# Patient Record
Sex: Male | Born: 1975 | Race: Asian | Hispanic: No | Marital: Married | Smoking: Never smoker
Health system: Southern US, Community
[De-identification: ages and names within clinical notes are randomized; demographics above are authoritative.]

## PROBLEM LIST (undated history)

## (undated) DIAGNOSIS — L503 Dermatographic urticaria: Secondary | ICD-10-CM

## (undated) HISTORY — DX: Dermatographic urticaria: L50.3

---

## 2015-10-20 ENCOUNTER — Ambulatory Visit (INDEPENDENT_AMBULATORY_CARE_PROVIDER_SITE_OTHER): Payer: Managed Care, Other (non HMO) | Admitting: Physician Assistant

## 2015-10-20 ENCOUNTER — Ambulatory Visit (INDEPENDENT_AMBULATORY_CARE_PROVIDER_SITE_OTHER): Payer: Managed Care, Other (non HMO)

## 2015-10-20 VITALS — BP 116/72 | HR 69 | Temp 98.2°F | Resp 16 | Ht 68.75 in | Wt 159.0 lb

## 2015-10-20 DIAGNOSIS — R51 Headache: Secondary | ICD-10-CM

## 2015-10-20 DIAGNOSIS — J320 Chronic maxillary sinusitis: Secondary | ICD-10-CM | POA: Diagnosis not present

## 2015-10-20 DIAGNOSIS — R519 Headache, unspecified: Secondary | ICD-10-CM

## 2015-10-20 MED ORDER — IBUPROFEN 800 MG PO TABS: ORAL_TABLET | ORAL | Status: DC

## 2015-10-20 MED ORDER — KETOROLAC TROMETHAMINE 60 MG/2ML IM SOLN
60.0000 mg | Freq: Once | INTRAMUSCULAR | Status: AC
  Administered 2015-10-20: 60 mg via INTRAMUSCULAR

## 2015-10-20 NOTE — Progress Notes (Signed)
10/21/2015 10:36 AM   DOB: 1975/08/30 / MRN: [id number]  SUBJECTIVE:  Jeffrey Bender is a 40 y.o. well apperaing male presenting for left sided HA that occurred yesterday and lasted roughly 4-5 hours then resolved.  Reports it started again today at 11 am and is now easing off.  Complains of scotoma of the left eye.  Denies nausea and pulsatility.  He has tried one 500 mg Tylenol with no relief.  No new stressors at home or work.  He did take a sick day today from work due to the HA.  His wife is with him and is very worried.    He has No Known Allergies.   He  has no past medical history on file.    He  reports that he has never smoked. He does not have any smokeless tobacco history on file. He reports that he drinks alcohol. He reports that he does not use illicit drugs. He  has no sexual activity history on file. The patient  has no past surgical history on file.  His family history is not on file.  Review of Systems  Constitutional: Negative for fever.  HENT: Negative for congestion, ear discharge, ear pain, hearing loss and sore throat.   Eyes: Negative for redness.  Gastrointestinal: Negative for abdominal pain.  Musculoskeletal: Negative for neck pain.  Skin: Negative for rash.  Neurological: Negative for dizziness.    Problem list and medications reviewed and updated by myself where necessary, and exist elsewhere in the encounter.   OBJECTIVE:  BP 116/72 mmHg  Pulse 69  Temp(Src) 98.2 F (36.8 C) (Oral)  Resp 16  Ht 5' 8.75" (1.746 m)  Wt 159 lb (72.122 kg)  BMI 23.66 kg/m2  SpO2 98%  Physical Exam  Constitutional: He is oriented to person, place, and time. He appears well-developed. He does not appear ill.  Eyes: Conjunctivae and EOM are normal. Pupils are equal, round, and reactive to light.  Cardiovascular: Normal rate.   Pulmonary/Chest: Effort normal.  Abdominal: He exhibits no distension.  Musculoskeletal: Normal range of motion.  Neurological:  He is alert and oriented to person, place, and time. He has normal reflexes. No cranial nerve deficit or sensory deficit. Coordination and gait normal.  Heel and toe walking intact.   Skin: Skin is warm and dry. He is not diaphoretic.  Psychiatric: He has a normal mood and affect.  Nursing note and vitals reviewed.   No results found for this or any previous visit (from the past 72 hour(s)).  Dg Sinuses Complete  10/20/2015  CLINICAL DATA:  Headache.  Sinus tenderness. EXAM: PARANASAL SINUSES - COMPLETE 3 + VIEW COMPARISON:  None. FINDINGS: Marked right maxillary sinus mucosal thickening. No paranasal sinus air-fluid levels. IMPRESSION: Marked chronic right maxillary sinusitis. Electronically Signed   By: Beckie Salts M.D.   On: 10/20/2015 17:23    ASSESSMENT AND PLAN  Jeffrey Bender was seen today for headache.  Diagnoses and all orders for this visit:  Headache above the eye region -     ketorolac (TORADOL) injection 60 mg; Inject 2 mLs (60 mg total) into the muscle once. -     DG Sinuses Complete; Future -     Cancel: Ambulatory referral to Neurology  Chronic maxillary sinusitis: Given xray finding will treat.  Advised that if the Augmentin fails to help him in the first ten day then to call me and I will reinstate referral to neurology.  -     amoxicillin-clavulanate (  AUGMENTIN) 500-125 MG tablet; Take 1 tablet (500 mg total) by mouth 2 (two) times daily. If this resolves your headache then fill again and complete the course. -     amoxicillin-clavulanate (AUGMENTIN) 500-125 MG tablet; Take 1 tablet (500 mg total) by mouth 2 (two) times daily. Fill after 10/30/15.  Other orders -     ibuprofen (ADVIL,MOTRIN) 800 MG tablet; Take with food at the onset of headache.  Do not take Aleve or Goody's while taking this medication.    The patient was advised to call or return to clinic if he does not see an improvement in symptoms or to seek the care of the closest emergency department if he  worsens with the above plan.   Deliah BostonMichael Clark, MHS, PA-C [facility] 10/21/2015 10:36 AM

## 2015-10-20 NOTE — Patient Instructions (Signed)
     IF you received an x-ray today, you will receive an invoice from [facility]. Please contact [facility] at [phone] with questions or concerns regarding your invoice.   IF you received labwork today, you will receive an invoice from [facility]. Please contact [facility] at [phone] with questions or concerns regarding your invoice.   Our billing staff will not be able to assist you with questions regarding bills from these companies.  You will be contacted with the lab results as soon as they are available. The fastest way to get your results is to activate your My Chart account. Instructions are located on the last page of this paperwork. If you have not heard from us regarding the results in 2 weeks, please contact this office.      

## 2015-10-21 MED ORDER — AMOXICILLIN-POT CLAVULANATE 500-125 MG PO TABS: 1.0000 | ORAL_TABLET | Freq: Two times a day (BID) | ORAL | Status: DC

## 2015-10-22 ENCOUNTER — Telehealth: Payer: Self-pay | Admitting: Family Medicine

## 2015-10-22 ENCOUNTER — Encounter (HOSPITAL_COMMUNITY): Payer: Self-pay | Admitting: *Deleted

## 2015-10-22 ENCOUNTER — Emergency Department (HOSPITAL_COMMUNITY): Payer: Managed Care, Other (non HMO)

## 2015-10-22 ENCOUNTER — Emergency Department (HOSPITAL_COMMUNITY)
Admission: EM | Admit: 2015-10-22 | Discharge: 2015-10-22 | Disposition: A | Discharge status: Home / Self Care | Payer: Managed Care, Other (non HMO) | Attending: Emergency Medicine | Admitting: Emergency Medicine

## 2015-10-22 DIAGNOSIS — Z792 Long term (current) use of antibiotics: Secondary | ICD-10-CM | POA: Diagnosis not present

## 2015-10-22 DIAGNOSIS — J329 Chronic sinusitis, unspecified: Secondary | ICD-10-CM | POA: Insufficient documentation

## 2015-10-22 DIAGNOSIS — R519 Headache, unspecified: Secondary | ICD-10-CM

## 2015-10-22 DIAGNOSIS — Z79899 Other long term (current) drug therapy: Secondary | ICD-10-CM | POA: Diagnosis not present

## 2015-10-22 DIAGNOSIS — R51 Headache: Secondary | ICD-10-CM

## 2015-10-22 NOTE — ED Provider Notes (Signed)
CSN: [id number]     Arrival date & time 10/22/15  1021 History   First MD Initiated Contact with Patient 10/22/15 1104     Chief Complaint  Patient presents with  . Eye Pain   HPI   Jeffrey Bender is an otherwise healthy 40 y.o. male who presents to the ED for evaluation of headache. He states that on 4/13 he developed a severe headache behind his left eye. He states that the episode resolved on its own after a few hours. He states the following day the headache returned with associated blurry vision so he went to [facility]. He states at the [facility] he was told he has a right sided sinus infection and given a prescription for antibiotics and ibuprofen. He states he has taken the medication as prescribed and while he is taking the ibuprofen the pain will go down to 4/10 but once it wears off the pain returns. He states he was told to come to the ED for his persistent headache for CT scan. Pt states last dose ibuprofen 9:30 AM and right now the pain is tolerable with no blurry vision. Denies any associated dizziness, fever, chills, neck rigidity, nausea, vomiting. Denies URI symptoms including cough, nasal congestion, and rhinorrhea. His wife is with him who is very worried. Pt states he typically does not have headaches and has never had pain like this before. Declines pain medication/migraine cocktail at this time.  History reviewed. No pertinent past medical history. History reviewed. No pertinent past surgical history. No family history on file. Social History  Substance Use Topics  . Smoking status: Never Smoker   . Smokeless tobacco: None  . Alcohol Use: Yes    Review of Systems  All other systems reviewed and are negative.     Allergies  Review of patient's allergies indicates no known allergies.  Home Medications   Prior to Admission medications   Medication Sig Start Date End Date Taking? Authorizing Provider  amoxicillin-clavulanate (AUGMENTIN) 500-125 MG tablet Take 1 tablet  (500 mg total) by mouth 2 (two) times daily. If this resolves your headache then fill again and complete the course. 10/21/15  Yes Ofilia Neas, PA-C  ibuprofen (ADVIL,MOTRIN) 200 MG tablet Take 200-600 mg by mouth every 6 (six) hours as needed for moderate pain.   Yes Historical Provider, MD  Multiple Vitamin (MULTIVITAMIN WITH MINERALS) TABS tablet Take 1 tablet by mouth daily.   Yes Historical Provider, MD  amoxicillin-clavulanate (AUGMENTIN) 500-125 MG tablet Take 1 tablet (500 mg total) by mouth 2 (two) times daily. Fill after 10/30/15. Patient not taking: Reported on 10/22/2015 10/21/15   Ofilia Neas, PA-C  ibuprofen (ADVIL,MOTRIN) 800 MG tablet Take with food at the onset of headache.  Do not take Aleve or Goody's while taking this medication. Patient not taking: Reported on 10/22/2015 10/20/15   Ofilia Neas, PA-C   BP 132/72 mmHg  Pulse 59  Temp(Src) 97.8 F (36.6 C) (Oral)  Resp 16  SpO2 100% Physical Exam  Constitutional: He is oriented to person, place, and time.  HENT:  Right Ear: External ear normal.  Left Ear: External ear normal.  Nose: Nose normal.  Mouth/Throat: Oropharynx is clear and moist. No oropharyngeal exudate.  +L frontal sinus tenderness  Eyes: Conjunctivae and EOM are normal. Pupils are equal, round, and reactive to light.  Neck: Normal range of motion. Neck supple.  Cardiovascular: Normal rate, regular rhythm, normal heart sounds and intact distal pulses.   Pulmonary/Chest: Effort normal  and breath sounds normal. No respiratory distress. He has no wheezes. He exhibits no tenderness.  Abdominal: Soft. Bowel sounds are normal. He exhibits no distension. There is no tenderness. There is no rebound and no guarding.  Musculoskeletal: He exhibits no edema.  Neurological: He is alert and oriented to person, place, and time. He has normal strength. No cranial nerve deficit or sensory deficit. He displays a negative Romberg sign. Coordination and gait normal. GCS  eye subscore is 4. GCS verbal subscore is 5. GCS motor subscore is 6.  Skin: Skin is warm and dry.  Psychiatric: He has a normal mood and affect.  Nursing note and vitals reviewed.   ED Course  Procedures (including critical care time) Labs Review Labs Reviewed - No data to display  Imaging Review Dg Sinuses Complete  10/20/2015  CLINICAL DATA:  Headache.  Sinus tenderness. EXAM: PARANASAL SINUSES - COMPLETE 3 + VIEW COMPARISON:  None. FINDINGS: Marked right maxillary sinus mucosal thickening. No paranasal sinus air-fluid levels. IMPRESSION: Marked chronic right maxillary sinusitis. Electronically Signed   By: Beckie Salts M.D.   On: 10/20/2015 17:23   Ct Head Wo Contrast  10/22/2015  CLINICAL DATA:  Left periorbital headache. Blurred vision in the left thigh. EXAM: CT HEAD WITHOUT CONTRAST TECHNIQUE: Contiguous axial images were obtained from the base of the skull through the vertex without intravenous contrast. COMPARISON:  10/20/2015 sinus radiographs. FINDINGS: No evidence of parenchymal hemorrhage or extra-axial fluid collection. No mass lesion, mass effect, or midline shift. No CT evidence of acute infarction. Cerebral volume is age appropriate. No ventriculomegaly. Near complete opacification of the visualized right maxillary sinus. Essentially complete opacification of the bilateral ethmoidal air cells. Fluid level in the sphenoid sinus. Complete opacification of the bilateral frontal sinus. The mastoid air cells are unopacified. No evidence of calvarial fracture. Mildly expansile 2 cm lytic lesion in right calvarium near the vertex (series 3/ image 30), which demonstrates a narrow zone of transition and thin sclerotic margin. IMPRESSION: 1.  No evidence of acute intracranial abnormality. 2. Complete opacification of the bilateral frontal sinus and ethmoidal air cells. Near complete opacification of the visualized right maxillary sinus. Fluid level in the sphenoid sinus. Findings suggest  severe acute on chronic sinusitis, with underlying polyps or other mass lesions not excluded. Consider dedicated sinus CT on a short term outpatient basis as clinically warranted. 3. Nonspecific mildly expansile 2 cm lytic lesion in the right calvarium near the vertex without aggressive features, probably benign. Electronically Signed   By: Delbert Phenix M.D.   On: 10/22/2015 12:24   I have personally reviewed and evaluated these images and lab results as part of my medical decision-making.   EKG Interpretation None      MDM   Final diagnoses:  Acute nonintractable headache, unspecified headache type  Chronic sinusitis, unspecified location    C head negative for emergent intracranial abnormality. tehre is severe acute on chronic sinusitis with recommendations for sinus dedicated CT on outpatient basis. Pt does report his headache continues to be well controlled with home ibuprofen in the ED. He is neurologically intact with no focal deficits. Suspect migraine vs cluster headache and encouraged continued ibuprofen as needed with referral to neuro given for follow up. Regarding sinusitis, pt denies any congestion/URI symptoms. He was given a prescription for Augmentin at [facility]. Instructed to finish Augmentin as prescribed and f/u with ENT this week for further evaluation. Pt and his wife verbalized understanding and agreement. ER return precautions given.  Carlene CoriaSerena Y Matheson Vandehei, PA-C 10/23/15 [phone]  Bethann BerkshireJoseph Zammit, MD 10/24/15 [phone]

## 2015-10-22 NOTE — Telephone Encounter (Signed)
Wife called back stating that husband has unbearable left sided headache.  [facility]

## 2015-10-22 NOTE — ED Notes (Signed)
Patient transported to CT 

## 2015-10-22 NOTE — ED Notes (Signed)
Pt's wife reports pt started to have pain behind his L eye Thursday, [facility].  Was told he had a sinus infection and was given abx and pain med without relief.  Pt was instructed to come to the ED by his doctor is not getting any better to have a CT done d/t possible infection.  Pt denies any facial numbness or droop at this time.  No other obvious neuro deficits at this time.

## 2015-10-22 NOTE — Telephone Encounter (Signed)
Answering service called saying patient still has headache and would like a call back.  I left a message on the answering machine to call back or RTC.

## 2015-10-22 NOTE — Discharge Instructions (Signed)
Your CT scan today showed chronic sinusitis with acute exacerbation. It was otherwise unremarkable. Please call Dr. Lucky Rathkeosen's office (Ear, nose, throat specialist) to schedule a follow up evaluation of your sinusitis that was seen on CT scan today. You might need a special CT scan of the sinuses specifically for further evaluation. Finish taking your antibiotics as prescribed.  Otherwise the pain around your left eye is likely a bad headache. Take the ibuprofen you have as needed for pain. If you keep getting headaches please call Neurology to schedule a follow up appointment. Return to the ER for new or worsening symptoms.    Sinusitis, Adult Sinusitis is redness, soreness, and inflammation of the paranasal sinuses. Paranasal sinuses are air pockets within the bones of your face. They are located beneath your eyes, in the middle of your forehead, and above your eyes. In healthy paranasal sinuses, mucus is able to drain out, and air is able to circulate through them by way of your nose. However, when your paranasal sinuses are inflamed, mucus and air can become trapped. This can allow bacteria and other germs to grow and cause infection. Sinusitis can develop quickly and last only a short time (acute) or continue over a long period (chronic). Sinusitis that lasts for more than 12 weeks is considered chronic. CAUSES Causes of sinusitis include:  Allergies.  Structural abnormalities, such as displacement of the cartilage that separates your nostrils (deviated septum), which can decrease the air flow through your nose and sinuses and affect sinus drainage.  Functional abnormalities, such as when the small hairs (cilia) that line your sinuses and help remove mucus do not work properly or are not present. SIGNS AND SYMPTOMS Symptoms of acute and chronic sinusitis are the same. The primary symptoms are pain and pressure around the affected sinuses. Other symptoms include:  Upper  toothache.  Earache.  Headache.  Bad breath.  Decreased sense of smell and taste.  A cough, which worsens when you are lying flat.  Fatigue.  Fever.  Thick drainage from your nose, which often is green and may contain pus (purulent).  Swelling and warmth over the affected sinuses. DIAGNOSIS Your health care provider will perform a physical exam. During your exam, your health care provider may perform any of the following to help determine if you have acute sinusitis or chronic sinusitis:  Look in your nose for signs of abnormal growths in your nostrils (nasal polyps).  Tap over the affected sinus to check for signs of infection.  View the inside of your sinuses using an imaging device that has a light attached (endoscope). If your health care provider suspects that you have chronic sinusitis, one or more of the following tests may be recommended:  Allergy tests.  Nasal culture. A sample of mucus is taken from your nose, sent to a lab, and screened for bacteria.  Nasal cytology. A sample of mucus is taken from your nose and examined by your health care provider to determine if your sinusitis is related to an allergy. TREATMENT Most cases of acute sinusitis are related to a viral infection and will resolve on their own within 10 days. Sometimes, medicines are prescribed to help relieve symptoms of both acute and chronic sinusitis. These may include pain medicines, decongestants, nasal steroid sprays, or saline sprays. However, for sinusitis related to a bacterial infection, your health care provider will prescribe antibiotic medicines. These are medicines that will help kill the bacteria causing the infection. Rarely, sinusitis is caused by a fungal  infection. In these cases, your health care provider will prescribe antifungal medicine. For some cases of chronic sinusitis, surgery is needed. Generally, these are cases in which sinusitis recurs more than 3 times per year, despite  other treatments. HOME CARE INSTRUCTIONS  Drink plenty of water. Water helps thin the mucus so your sinuses can drain more easily.  Use a humidifier.  Inhale steam 3-4 times a day (for example, sit in the bathroom with the shower running).  Apply a warm, moist washcloth to your face 3-4 times a day, or as directed by your health care provider.  Use saline nasal sprays to help moisten and clean your sinuses.  Take medicines only as directed by your health care provider.  If you were prescribed either an antibiotic or antifungal medicine, finish it all even if you start to feel better. SEEK IMMEDIATE MEDICAL CARE IF:  You have increasing pain or severe headaches.  You have nausea, vomiting, or drowsiness.  You have swelling around your face.  You have vision problems.  You have a stiff neck.  You have difficulty breathing.   This information is not intended to replace advice given to you by your health care provider. Make sure you discuss any questions you have with your health care provider.   Document Released: 06/24/2005 Document Revised: 07/15/2014 Document Reviewed: 07/09/2011 Elsevier Interactive Patient Education [employer]

## 2015-11-06 HISTORY — PX: SINOSCOPY: SHX187

## 2015-12-18 ENCOUNTER — Ambulatory Visit: Payer: Managed Care, Other (non HMO) | Admitting: Allergy and Immunology

## 2016-01-24 ENCOUNTER — Ambulatory Visit: Payer: Managed Care, Other (non HMO) | Admitting: Allergy and Immunology

## 2016-02-14 ENCOUNTER — Encounter: Payer: Self-pay | Admitting: Allergy & Immunology

## 2016-02-14 ENCOUNTER — Ambulatory Visit (INDEPENDENT_AMBULATORY_CARE_PROVIDER_SITE_OTHER): Payer: Managed Care, Other (non HMO) | Admitting: Allergy & Immunology

## 2016-02-14 VITALS — BP 120/70 | HR 87 | Temp 98.2°F | Resp 16 | Ht 71.0 in | Wt 160.8 lb

## 2016-02-14 DIAGNOSIS — L503 Dermatographic urticaria: Secondary | ICD-10-CM

## 2016-02-14 DIAGNOSIS — J310 Chronic rhinitis: Secondary | ICD-10-CM | POA: Insufficient documentation

## 2016-02-14 DIAGNOSIS — Z9889 Other specified postprocedural states: Secondary | ICD-10-CM | POA: Insufficient documentation

## 2016-02-14 HISTORY — DX: Dermatographic urticaria: L50.3

## 2016-02-14 NOTE — Patient Instructions (Signed)
1. Chronic rhinitis - Testing today showed evidence of allergies to grass, weeds, trees, mold, dust mite, mixed feathers - However, with your sensitive skin it was difficult to interpret them. - To confirm these allergies, we are going to get lab work.  - We will call you in one week with the results - In the meantime, start Dymista one spray per nostril twice daily. This contains a nasal steroid to control inflammation and a nasal antihistamine to control allergic responses in the nose.   2. Return to clinic as needed for now.   It was a pleasure to meet you today!

## 2016-02-14 NOTE — Progress Notes (Signed)
NEW PATIENT  Date of Service/Encounter:  02/14/16   Assessment:   Chronic rhinitis - SPT positive to grass, weeds, trees, mold, dust mite, mixed feathers, mouse (02/14/16) - Plan: Allergy Test, Allergens, Zone 3  H/O endoscopic sinus surgery  Dermatographism   Plan/Recommendations:    . Chronic rhinitis s/p endoscopic sinus surgery - Testing today showed evidence of allergies to grass, weeds, trees, mold, dust mite, mixed feathers - However, the patient's dermatogrpahism made interpretation difficult. - Therefore, to confirm these allergies, we are going to get lab work.  - We will call the patient in one week with the results.  - There is a marked mismatch between his symptoms and sinus CT findings and skin testing findings today. One would expect much worse symptoms, however he is denying any symptoms aside from the headache.  - There were no polyps appreciated today. When polyps are present in an adult, the differential diagnoses include a mild form of cystic fibrosis (unlikely given the lack of history in the family and the patient), aspirin-exacerbated respiratory disease (unlikely since the patient tolerates NSAIDs and does not have asthma), and uncontrolled atopic disease (most likely etiology).  - In the meantime, we started Dymista one spray per nostril twice daily - The combination of a nasal steroid to control inflammation and a nasal antihistamine to control allergic responses could prove helpful. - We briefly discussed allergen immunotherapy as a means of controlling his nasal symptoms and preventing future sinusitis, however he wanted to hold off until the blood work was completed.   2. Return to clinic as needed for now.     Subjective:   Jeffrey Bender is a 40 y.o. male presenting today for evaluation of  Chief Complaint  Patient presents with  . Sinusitis    had surgery in May 2017   . Allergy Testing    needs to know what caused his  sinusitis  .  Jeffrey Bender has a history of the following: Patient Active Problem List   Diagnosis Date Noted  . Chronic rhinitis 02/14/2016  . H/O endoscopic sinus surgery 02/14/2016    History obtained from: chart review and patient.  Jeffrey Bender was referred by No primary care provider on file.Jeffrey Bender is a 40yo male with a history of chronic sinusitis presenting for allergy testing. He is followed by Dr. Izora Gala (ENT). The patient's problems seem to have started in April 2017. At that time, he developed severe left-sided headaches with blurry vision. He never had any nasal symptoms at all including rhinorrhea and congestion with post-nasal drip. He denies itchy watery eyes. He has never had a problem with allergy symptoms at all. He does not have a history of migraines.   He went to the ED because of the pain. Despite having no nasal symptoms, a head CT noted complete frontal and ethmoidal opacification as well as a mucous retention cyst in the right maxillary sinus. He was placed on a prolonged course of antibiotics for one month and a repeat sinus CT in May 2017 noted continued opacification of the frontal sinuses and ethmoid sinuses bilaterally which was felt to have worsened since the first surgery. He did feel better while on the antibiotics but once he stopped the symptoms returned.   Because there was no improvement, Dr. Constance Holster recommended an endoscopic sinus surgery involving the bilateral ethmoid as well as bilateral maxillary and frontal sinuses. This was performed on 11/22/15. There were also nasal  polyps noted. He reports feeling better after the surgery. He did not have tonsils and adenoids taken out. His vision has returned to normal.   Jeffrey Bender reports that he has been in [place] for six years and then in the [place] for ten years total. He wants to figure out why his sinuses became such an issue. He did start  swimming last year but otherwise he has had no changes to his routines. Prior to April, he does not recall the last time that he was treated with antibiotics. He was not sick as a child.   CT PARANASAL SINUS WITHOUT CONTRAST (11/14/15)  FINDINGS: Large retention cyst fills the right maxillary sinus unchanged. Mucosal edema occludes the ostiomeatal complex on the right.  Near complete opacification of the left maxillary sinus with air-fluid level. Air-fluid level has developed in the interval. Ostiomeatal complex is occluded on the left.  Complete opacification of the frontal and ethmoid sinuses bilaterally. Progressive opacification of the ethmoid sinuses since prior CT.  Fluid level in the sphenoid sinus as noted previously.  Nasal septum deviated to the right. No acute skeletal change.  Mastoid sinus incompletely visualized.  IMPRESSION: Complete opacification and enlargement of the frontal sinuses bilaterally. Complete opacification of the ethmoid sinuses bilaterally with progression since prior study.  Interval development of fluid level left maxillary sinus which is nearly completely opacified.  Maxillary sinus obstruction bilaterally.  Fluid level sphenoid sinus unchanged  Right maxillary sinus retention cyst unchanged.     Jeffrey Bender has never wheezed or required albuterol. He has never had any traditional allergy symptoms. He has never had any problems with stinging insect allergies, drug allergies, or urticaria. He has never had problems with atopic dermatitis. His infectious history is largely unremarkable before April 2017. He does not remember the last time that he needed antibiotics prior to April 2017. Vaccinations are up-to-date.   Past Medical History: Patient Active Problem List   Diagnosis Date Noted  . Chronic rhinitis 02/14/2016  . H/O endoscopic sinus surgery 02/14/2016    Medication List:    Medication List    as of 02/14/2016  3:32 PM   You have not  been prescribed any medications.     Birth History: non-contributory. He was born in [place] without complications.   Developmental History: Jeffrey Bender has met all milestones on time.   Past Surgical History: Past Surgical History:  Procedure Laterality Date  . SINOSCOPY  11/2015     Family History: Family History  Problem Relation Age of Onset  . Allergic rhinitis Neg Hx   . Angioedema Neg Hx   . Asthma Neg Hx   . Immunodeficiency Neg Hx   . Urticaria Neg Hx   . Eczema Neg Hx      Social History:  Jeffrey Bender lives at home with his wife and 80yo daughter. There are no pets and no smoking. He works as a Biochemist, clinical. He lives in a house that was 38 years old. There is hardwood throughout. They have a gas heating system as well as a central cooling system. There is no cigarette exposure.  Review of Systems: a 14-point review of systems is pertinent for what is mentioned in HPI.  Otherwise, all other systems were negative. Constitutional: negative other than that listed in the HPI Eyes: negative other than that listed in the HPI Ears, nose, mouth, throat, and face: negative other than that listed in the HPI Respiratory: negative other than that listed in the HPI Cardiovascular: negative other than  that listed in the HPI Gastrointestinal: negative other than that listed in the HPI Genitourinary: negative other than that listed in the HPI Integument: negative other than that listed in the HPI Hematologic: negative other than that listed in the HPI Musculoskeletal: negative other than that listed in the HPI Neurological: negative other than that listed in the HPI Allergy/Immunologic: negative other than that listed in the HPI    Objective:   Blood pressure 120/70, pulse 87, temperature 98.2 F (36.8 C), temperature source Oral, resp. rate 16, height _0  (1.803 m), weight 160 lb 12.8 oz (72.9 kg), SpO2 98 %. Body mass index is 22.43 kg/m.   Physical  Exam:  General: Alert, interactive, in no acute distress. Very pleasant male.  HEENT: TMs pearly gray, turbinates minimally edematous with clear discharge, post-pharynx mildly erythematous. Neck: Supple without lymphadenopathy. Lungs: Clear to auscultation without wheezing, rhonchi or rales. No crackles. No increased work of breathing.  CV: Normal S1, S2 without murmurs. Abdomen: Nondistended, nontender. Skin:Warm and dry, without lesions or rashes. Extremities: No clubbing, cyanosis or edema. Neuro: Grossly intact. No focal deficits noted  Diagnostic studies:  Allergy Studies:   Indoor/Outdoor Aeroallergen Panel: Positive for grasses, weeds, trees, one isolated mold, dust mites, mixed feathers, and mouse. However, he was quite dermatographic, making interpretation difficult at best.     Jeffrey Marvel, MD [facility]

## 2016-02-17 LAB — ALLERGENS, ZONE 3
Bahia Grass IgE: <0.1 kU/L
Bermuda Grass IgE: <0.1 kU/L
Cat Dander IgE: <0.1 kU/L
Cedar, Mountain IgE: <0.1 kU/L
Cladosporium Herbarum IgE: <0.1 kU/L
Cockroach, American IgE: 3.51 kU/L — AB
D Pteronyssinus IgE: 3.81 kU/L — AB
D002-IGE D FARINAE: 2.92 kU/L — AB
Dog Dander IgE: <0.1 kU/L
Elm, American IgE: <0.1 kU/L
Hickory, White IgE: <0.1 kU/L
Johnson Grass IgE: <0.1 kU/L
Maple/Box Elder IgE: <0.1 kU/L
Nettle IgE: <0.1 kU/L
Penicillium Chrysogen IgE: <0.1 kU/L
White Mulberry IgE: <0.1 kU/L

## 2016-02-22 ENCOUNTER — Encounter: Payer: Self-pay | Admitting: *Deleted

## 2017-11-24 ENCOUNTER — Other Ambulatory Visit: Payer: Self-pay | Admitting: Otolaryngology

## 2017-11-24 DIAGNOSIS — J310 Chronic rhinitis: Secondary | ICD-10-CM

## 2017-11-24 DIAGNOSIS — J321 Chronic frontal sinusitis: Secondary | ICD-10-CM

## 2017-11-25 ENCOUNTER — Ambulatory Visit
Admission: RE | Admit: 2017-11-25 | Discharge: 2017-11-25 | Disposition: A | Discharge status: Home / Self Care | Payer: Managed Care, Other (non HMO) | Source: Ambulatory Visit | Attending: Otolaryngology | Admitting: Otolaryngology

## 2017-11-25 DIAGNOSIS — J310 Chronic rhinitis: Secondary | ICD-10-CM

## 2017-11-25 DIAGNOSIS — J321 Chronic frontal sinusitis: Secondary | ICD-10-CM

## 2019-09-08 ENCOUNTER — Ambulatory Visit: Payer: Managed Care, Other (non HMO) | Attending: Internal Medicine

## 2019-09-08 DIAGNOSIS — Z23 Encounter for immunization: Secondary | ICD-10-CM | POA: Insufficient documentation

## 2019-09-08 NOTE — Progress Notes (Signed)
   Covid-19 Vaccination Clinic  Name:  Jeffrey Bender    MRN: [id number] DOB: 1976/05/13  09/08/2019  [name] was observed post Covid-19 immunization for 15 minutes without incident. He was provided with Vaccine Information Sheet and instruction to access the V-Safe system.   [name] was instructed to call 911 with any severe reactions post vaccine: Marland Kitchen Difficulty breathing  . Swelling of face and throat  . A fast heartbeat  . A bad rash all over body  . Dizziness and weakness   Immunizations Administered    Name Date Dose VIS Date Route   Pfizer COVID-19 Vaccine 09/08/2019  4:55 PM 0.3 mL 06/18/2019 Intramuscular   Manufacturer: [employer], [employer]   Lot: [id number]   NDC: [address]-8

## 2019-10-05 ENCOUNTER — Ambulatory Visit: Payer: Managed Care, Other (non HMO)

## 2019-10-05 ENCOUNTER — Ambulatory Visit: Payer: Managed Care, Other (non HMO) | Attending: Internal Medicine

## 2019-10-05 DIAGNOSIS — Z23 Encounter for immunization: Secondary | ICD-10-CM

## 2019-10-05 NOTE — Progress Notes (Signed)
   Covid-19 Vaccination Clinic  Name:  Jeffrey Bender    MRN: [id number] DOB: Nov 25, 1975  10/05/2019  [name] was observed post Covid-19 immunization for 15 minutes without incident. He was provided with Vaccine Information Sheet and instruction to access the V-Safe system.   [name] was instructed to call 911 with any severe reactions post vaccine: Marland Kitchen Difficulty breathing  . Swelling of face and throat  . A fast heartbeat  . A bad rash all over body  . Dizziness and weakness   Immunizations Administered    Name Date Dose VIS Date Route   Pfizer COVID-19 Vaccine 10/05/2019  4:36 PM 0.3 mL 06/18/2019 Intramuscular   Manufacturer: [employer], [employer]   Lot: [id number]   NDC: [address]-7

## 2019-12-13 IMAGING — CT CT MAXILLOFACIAL W/O CM
1 series · 15 of 30 positions shown, 19 images · non-contrast
Comparison: 09/12/2017

CLINICAL DATA: Chronic rhinosinusitis.

EXAM:
CT MAXILLOFACIAL WITHOUT CONTRAST
TECHNIQUE: Multidetector CT images of the paranasal sinuses were obtained using
the standard protocol without intravenous contrast.

[Series 4: maxofacial soft · axial · 0.48mm/px · z∈[-200,-38]mm · 15 of 59 slices shown, 19 images]
[im 3/59  brain]
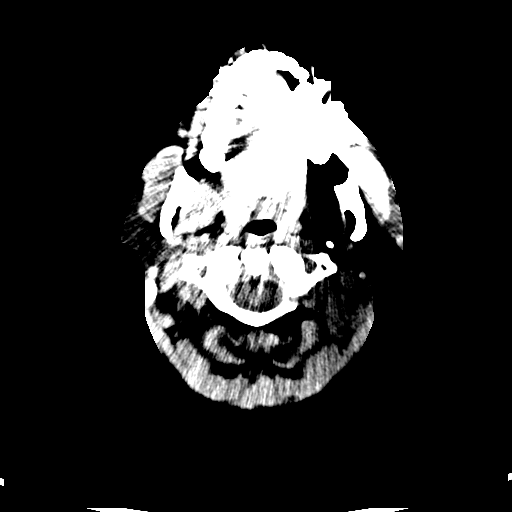
[im 3/59  bone]
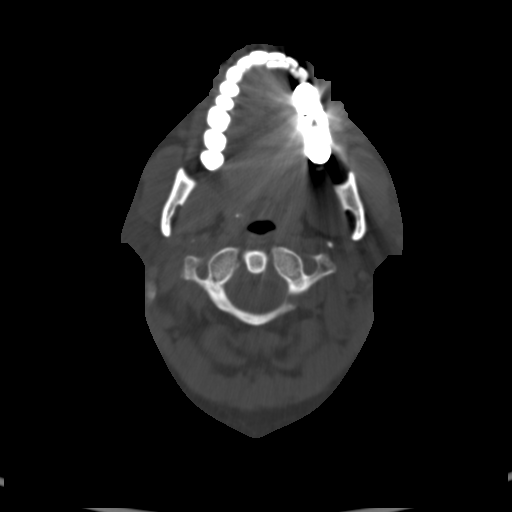
[im 7/59  bone]
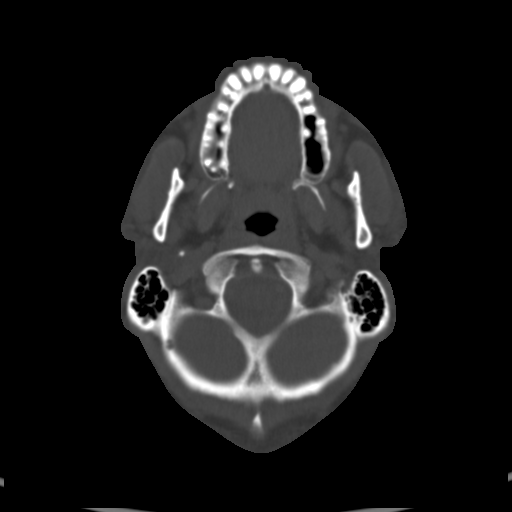
[im 11/59  bone]
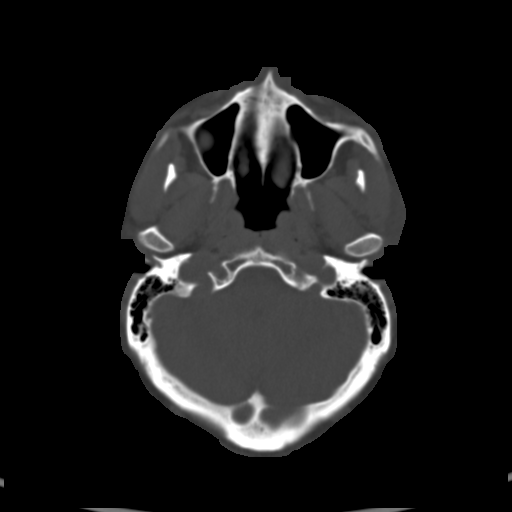
[im 15/59  bone]
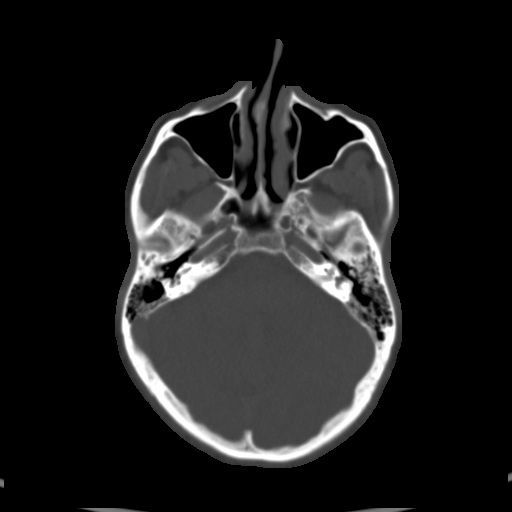
[im 19/59  brain]
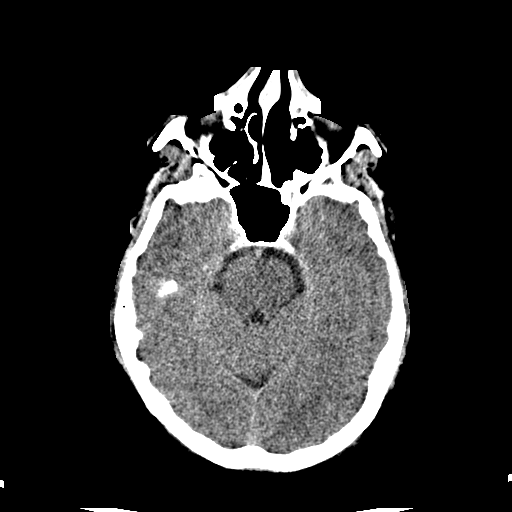
[im 19/59  bone]
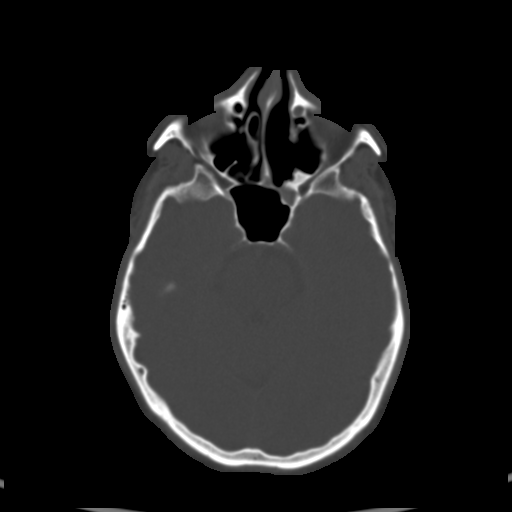
[im 23/59  bone]
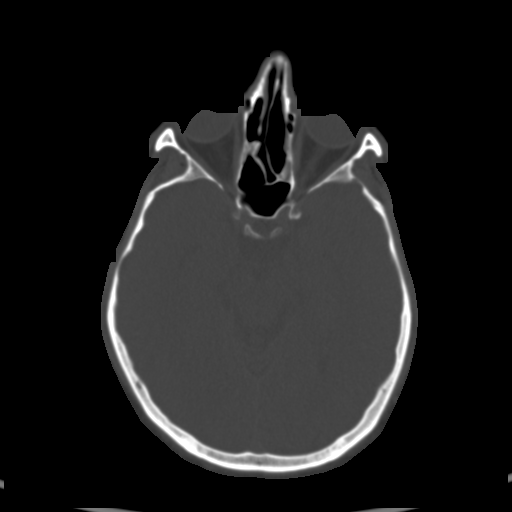
[im 27/59  bone]
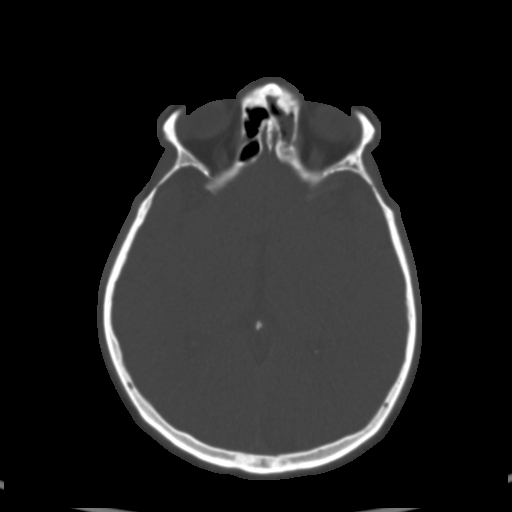
[im 31/59  bone]
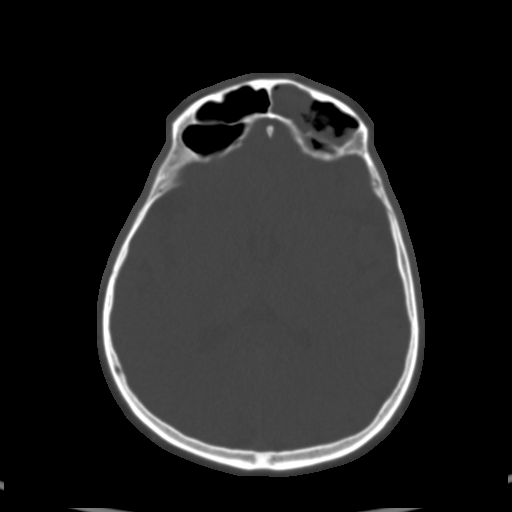
[im 33/59  brain]
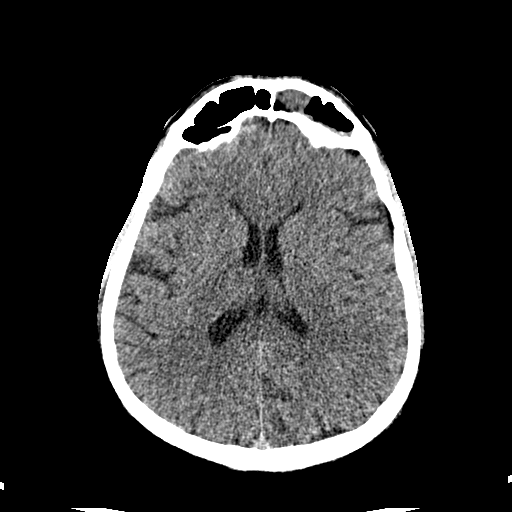
[im 33/59  bone]
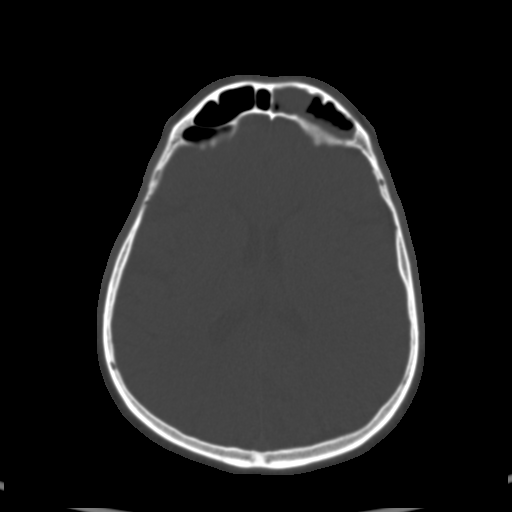
[im 37/59  bone]
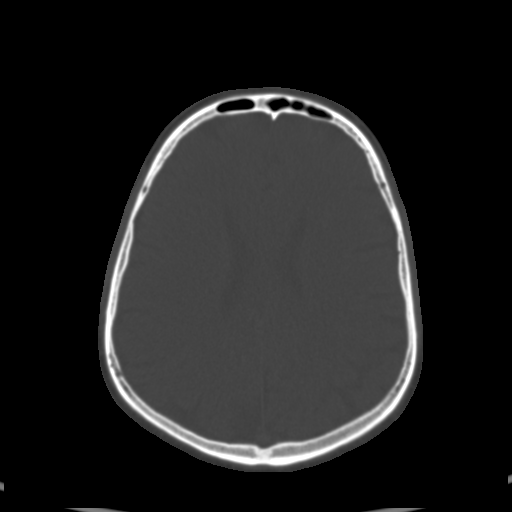
[im 41/59  bone]
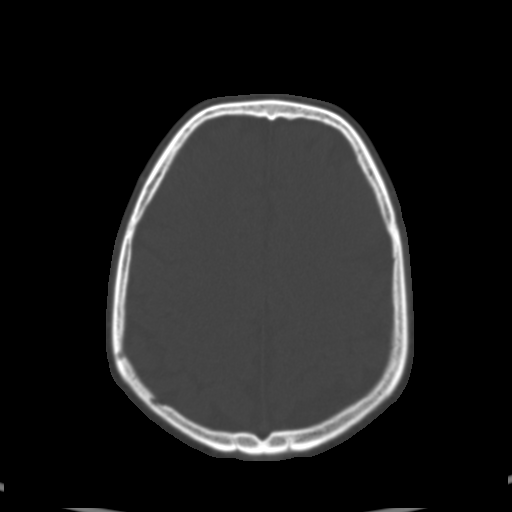
[im 45/59  bone]
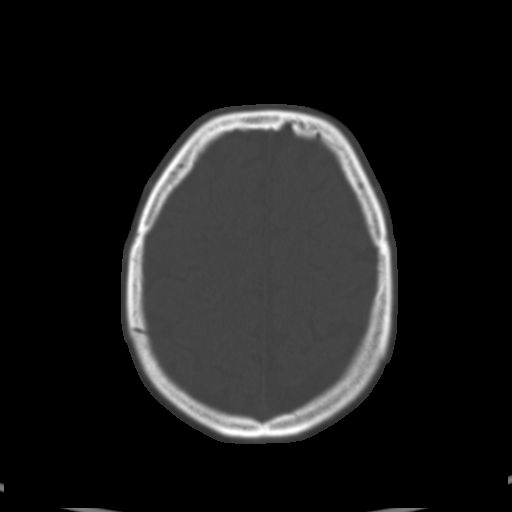
[im 49/59  brain]
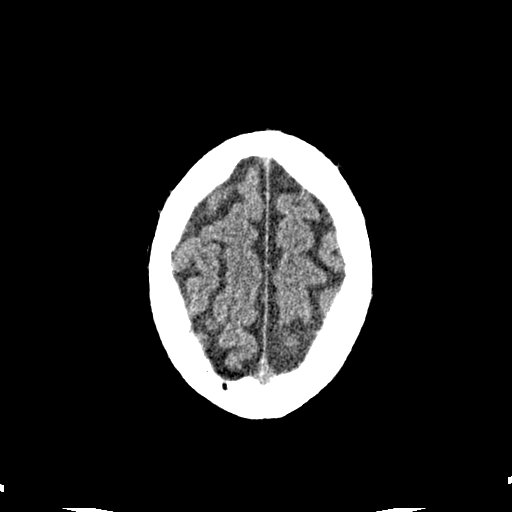
[im 49/59  bone]
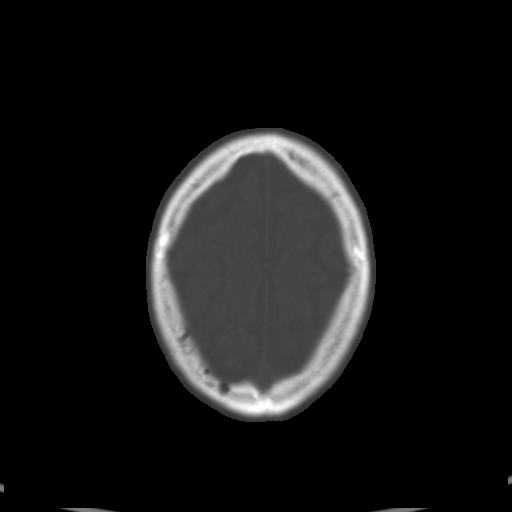
[im 53/59  bone]
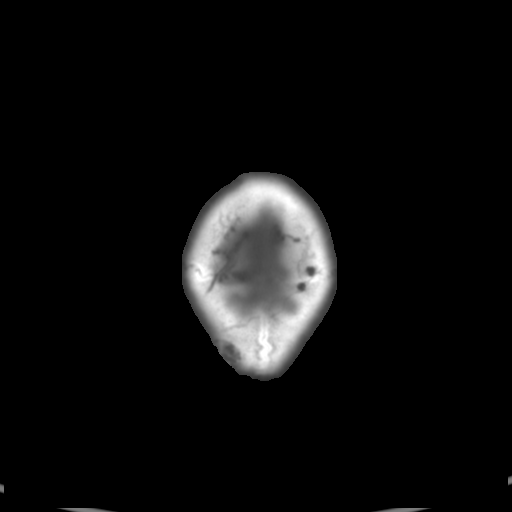
[im 57/59  bone]
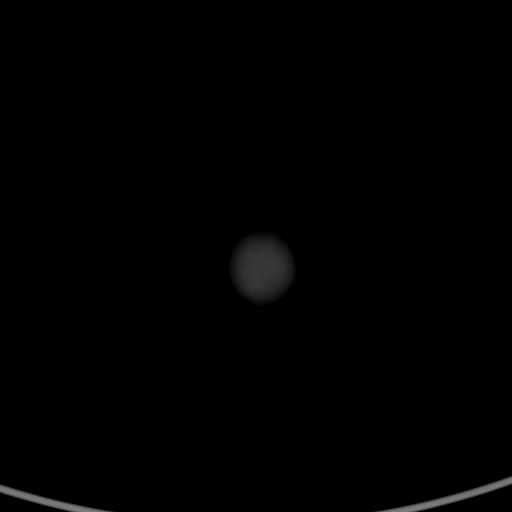

[15 of 30 positions shown; findings below may reference images not displayed]

FINDINGS: Paranasal sinuses:

Frontal: Moderate opacification of the left frontal sinus. Left
frontal recess is occluded. The right frontal sinus is clear in the
right frontal recess is patent.

Ethmoid: Prior ethmoidectomy without opacification.

Maxillary: Small retention cysts within the right maxillary sinus.
Left maxillary sinus is clear. Bilateral maxillary antrostomies and
uncinectomies.

Sphenoid: Normally aerated. Patent sphenoethmoidal recesses.

Right ostiomeatal unit: Patent.

Left ostiomeatal unit: Patent.

Nasal passages: Patent. 5 mm rightward septal deviation. Right
concha bullosa. The left superior turbinate has been resected.

Anatomy: There is aeration superior to both anterior ethmoid
notches. Symmetric and intact olfactory grooves and fovea
ethmoidalis, Keros III (8-16mm) Sellar sphenoid pneumatization
pattern. No optic canal dehiscence. Right-sided 0 no D cell.

Other: Orbits and intracranial compartment are unremarkable. Visible
mastoid air cells are normally aerated.
IMPRESSION: 1. Left frontal sinus partial opacification with occlusion of the
left frontal recess. The degree of opacification of the left frontal
sinus has improved from the prior study.
2. Extensive postsurgical change with otherwise clear paranasal
sinuses and sinus drainage pathways, aside from small retention
cysts in the right maxillary sinus.

## 2021-03-06 ENCOUNTER — Ambulatory Visit (INDEPENDENT_AMBULATORY_CARE_PROVIDER_SITE_OTHER): Payer: Managed Care, Other (non HMO) | Admitting: Neurology

## 2021-03-06 ENCOUNTER — Encounter: Payer: Self-pay | Admitting: Neurology

## 2021-03-06 VITALS — BP 114/75 | HR 68 | Ht 71.0 in | Wt 167.0 lb

## 2021-03-06 DIAGNOSIS — G5603 Carpal tunnel syndrome, bilateral upper limbs: Secondary | ICD-10-CM

## 2021-03-06 DIAGNOSIS — R200 Anesthesia of skin: Secondary | ICD-10-CM | POA: Insufficient documentation

## 2021-03-06 DIAGNOSIS — R202 Paresthesia of skin: Secondary | ICD-10-CM | POA: Diagnosis not present

## 2021-03-06 NOTE — Progress Notes (Signed)
[facility]    Provider:  Dr Lucia Gaskins Requesting Provider: Ileana Ladd, MD Primary Care Provider:  Ileana Ladd, MD  CC:  burning pain in fingertips.   HPI:  Jeffrey Bender is a 45 y.o. male here as requested by Ileana Ladd, MD for pins and needles of his finger tips when using his phone, needs nerve conduction studies.  Past medical history vegetarian, sinus headache, hyperglycemia, or vitamin B12 deficiency, paresthesias, I reviewed Dr. Nash Dimmer notes, patient's been reporting pins-and-needles of his fingertips when using his phone, labs have not been revealing, he recommended nerve conduction studies, started 2 months ago, tingling and burning, otherwise general examination was normal including skin eyes ears nose neck lungs heart abdomen lymph nodes, labs included B12 which was normal, CBC which was unremarkable essentially normal, CMP was normal with a BUN of 14 and a creatinine of 0.72, TSH was normal, hemoglobin A1c was 6 collected December 07, 2020.  Last few months he has burning in digits 1-2 bilaterally hands, when he wake up in the morning may be worse, he types a lot, also having pain in the wrist, no weakness, worse with using phone, happens every day, also with 2-3 hours of computer use. No inciting event, started slowly and progressively worse, wrist splints have not helped, slowly progressive. No neck pain or shooting pain into the hands. No cervical radicular symptoms. Symmetrical.   Reviewed notes, labs and imaging from outside physicians, which showed:  CT head 2017: viewed images and agree: IMPRESSION: 1.  No evidence of acute intracranial abnormality. 2. Complete opacification of the bilateral frontal sinus and ethmoidal air cells. Near complete opacification of the visualized right maxillary sinus. Fluid level in the sphenoid sinus. Findings suggest severe acute on chronic sinusitis, with underlying polyps or other mass lesions not  excluded. Consider dedicated sinus CT on a short term outpatient basis as clinically warranted  Review of Systems: Patient complains of symptoms per HPI as well as the following symptoms b12 deficiency/vegetarian,prediabetes. Pertinent negatives and positives per HPI. All others negative.   Social History   Socioeconomic History   Marital status: Married    Spouse name: Not on file   Number of children: Not on file   Years of education: Not on file   Highest education level: Not on file  Occupational History   Not on file  Tobacco Use   Smoking status: Never   Smokeless tobacco: Never  Vaping Use   Vaping Use: Never used  Substance and Sexual Activity   Alcohol use: Yes    Comment: one beer once week   Drug use: No   Sexual activity: Not on file  Other Topics Concern   Not on file  Social History Narrative   I cup coffe in am, and 1 deca tea in afternoon, education: Buyer, retail IT, Work:  Chartered loss adjuster , works from home.    Social Determinants of Health   Financial Resource Strain: Not on file  Food Insecurity: Not on file  Transportation Needs: Not on file  Physical Activity: Not on file  Stress: Not on file  Social Connections: Not on file  Intimate Partner Violence: Not on file    Family History  Problem Relation Age of Onset   Heart attack Father    Allergic rhinitis Neg Hx    Angioedema Neg Hx    Asthma Neg Hx    Immunodeficiency Neg Hx    Urticaria Neg Hx    Eczema Neg Hx  Past Medical History:  Diagnosis Date   Dermatographism 02/14/2016    Patient Active Problem List   Diagnosis Date Noted   Numbness and tingling in both hands 03/06/2021   Chronic rhinitis 02/14/2016   H/O endoscopic sinus surgery 02/14/2016   Dermatographism 02/14/2016    Past Surgical History:  Procedure Laterality Date   SINOSCOPY  11/2015   also twice in 2018    Current Outpatient Medications  Medication Sig Dispense Refill   Multiple Vitamin (MULTIVITAMIN WITH MINERALS)  TABS tablet Take 1 tablet by mouth daily.     vitamin B-12 (CYANOCOBALAMIN) 500 MCG tablet Take 500 mcg by mouth daily.     No current facility-administered medications for this visit.    Allergies as of 03/06/2021   (No Known Allergies)    Vitals: BP 114/75   Pulse 68   Ht 5\' 11"  (1.803 m)   Wt 167 lb (75.8 kg)   BMI 23.29 kg/m  Last Weight:  Wt Readings from Last 1 Encounters:  03/06/21 167 lb (75.8 kg)   Last Height:   Ht Readings from Last 1 Encounters:  03/06/21 5\' 11"  (1.803 m)     Physical exam: Exam: Gen: NAD, conversant, well nourised, well groomed                     CV: RRR, no MRG. No Carotid Bruits. No peripheral edema, warm, nontender Eyes: Conjunctivae clear without exudates or hemorrhage  Neuro: Detailed Neurologic Exam  Speech:    Speech is normal; fluent and spontaneous with normal comprehension.  Cognition:    The patient is oriented to person, place, and time;     recent and remote memory intact;     language fluent;     normal attention, concentration,     fund of knowledge Cranial Nerves:    The pupils are equal, round, and reactive to light. The fundi are flat. Visual fields are full to finger confrontation. Extraocular movements are intact. Trigeminal sensation is intact and the muscles of mastication are normal. The face is symmetric. The palate elevates in the midline. Hearing intact. Voice is normal. Shoulder shrug is normal. The tongue has normal motion without fasciculations.   Coordination:    Normal   Gait:    normal.   Motor Observation:    No asymmetry, no atrophy, and no involuntary movements noted. Tone:    Normal muscle tone.    Posture:    Posture is normal. normal erect    Strength:    Strength is V/V in the upper and lower limbs.      Sensation: intact to LT     Reflex Exam:  DTR's:    Deep tendon reflexes in the upper and lower extremities are normal bilaterally.   Toes:    The toes are downgoing  bilaterally.   Clonus:    Clonus is absent.   +Mcphalen's sign  Assessment/Plan:  Likely Carpal Tunnel Syndrome  Occupational therapy for Carpal Tunnel Syndrome: [facility] [phone]) Order EMG/NCS in 4-6 weeks if OT doesn't help come get the test and we will send you to Dr. (also at [facility])  Orders Placed This Encounter  Procedures   Ambulatory referral to Occupational Therapy   NCV with EMG(electromyography)   No orders of the defined types were placed in this encounter.   Cc: ([phone], MD,  Amanda Pea, MD  Ileana Ladd, MD  [facility] [address] [place],  [place] [address]  Phone [phone] Fax [phone]

## 2021-03-06 NOTE — Patient Instructions (Addendum)
Occupational therapy for Carpal Tunnel Syndrome: [facility] [phone]) Order EMG/NCS in 4-6 weeks if OT doesn't help come get the test and we will send you to Dr. Amanda Pea (also at [facility])   Carpal Tunnel Syndrome  Carpal tunnel syndrome is a condition that causes pain, numbness, and weakness in your hand and fingers. The carpal tunnel is a narrow area located on the palm side of your wrist. Repeated wrist motion or certain diseases may cause swelling within the tunnel. This swelling pinches the main nerve in the wrist.The main nerve in the wrist is called the median nerve. What are the causes? This condition may be caused by: Repeated and forceful wrist and hand motions. Wrist injuries. Arthritis. A cyst or tumor in the carpal tunnel. Fluid buildup during pregnancy. Use of tools that vibrate. Sometimes the cause of this condition is not known. What increases the risk? The following factors may make you more likely to develop this condition: Having a job that requires you to repeatedly or forcefully move your wrist or hand or requires you to use tools that vibrate. This may include jobs that involve using computers, working on an [employer], or working with power tools such as Radiographer, therapeutic. Being a woman. Having certain conditions, such as: Diabetes. Obesity. An underactive thyroid (hypothyroidism). Kidney failure. Rheumatoid arthritis. What are the signs or symptoms? Symptoms of this condition include: A tingling feeling in your fingers, especially in your thumb, index, and middle fingers. Tingling or numbness in your hand. An aching feeling in your entire arm, especially when your wrist and elbow are bent for a long time. Wrist pain that goes up your arm to your shoulder. Pain that goes down into your palm or fingers. A weak feeling in your hands. You may have trouble grabbing and holding items. Your symptoms may feel worse during the night. How is this  diagnosed? This condition is diagnosed with a medical history and physical exam. You may also have tests, including: Electromyogram (EMG). This test measures electrical signals sent by your nerves into the muscles. Nerve conduction study. This test measures how well electrical signals pass through your nerves. Imaging tests, such as X-rays, ultrasound, and MRI. These tests check for possible causes of your condition. How is this treated? This condition may be treated with: Lifestyle changes. It is important to stop or change the activity that caused your condition. Doing exercise and activities to strengthen and stretch your muscles and tendons (physical therapy). Making lifestyle changes to help with your condition and learning how to do your daily activities safely (occupational therapy). Medicines for pain and inflammation. This may include medicine that is injected into your wrist. A wrist splint or brace. Surgery. Follow these instructions at home: If you have a splint or brace: Wear the splint or brace as told by your health care provider. Remove it only as told by your health care provider. Loosen the splint or brace if your fingers tingle, become numb, or turn cold and blue. Keep the splint or brace clean. If the splint or brace is not waterproof: Do not let it get wet. Cover it with a watertight covering when you take a bath or shower. Managing pain, stiffness, and swelling If directed, put ice on the painful area. To do this: If you have a removeable splint or brace, remove it as told by your health care provider. Put ice in a plastic bag. Place a towel between your skin and the bag or between the  splint or brace and the bag. Leave the ice on for 20 minutes, 2-3 times a day. Do not fall asleep with the cold pack on your skin. Remove the ice if your skin turns bright red. This is very important. If you cannot feel pain, heat, or cold, you have a greater risk of damage to the  area. Move your fingers often to reduce stiffness and swelling. General instructions Take over-the-counter and prescription medicines only as told by your health care provider. Rest your wrist and hand from any activity that may be causing your pain. If your condition is work related, talk with your employer about changes that can be made, such as getting a wrist pad to use while typing. Do any exercises as told by your health care provider, physical therapist, or occupational therapist. Keep all follow-up visits. This is important. Contact a health care provider if: You have new symptoms. Your pain is not controlled with medicines. Your symptoms get worse. Get help right away if: You have severe numbness or tingling in your wrist or hand. Summary Carpal tunnel syndrome is a condition that causes pain, numbness, and weakness in your hand and fingers. It is usually caused by repeated wrist motions. Lifestyle changes and medicines are used to treat carpal tunnel syndrome. Surgery may be recommended. Follow your health care provider's instructions about wearing a splint, resting from activity, keeping follow-up visits, and calling for help. This information is not intended to replace advice given to you by your health care provider. Make sure you discuss any questions you have with your healthcare provider. Document Revised: 11/04/2019 Document Reviewed: 11/04/2019 Elsevier Patient Education  2022 Elsevier Inc.   Electromyoneurogram Electromyoneurogram is a test to check how well your muscles and nerves are working. This procedure includes the combined use of electromyogram (EMG) and nerve conduction study (NCS). EMG is used to look for muscular disorders. NCS, which is also called electroneurogram, measures how well your nerves arecontrolling your muscles. The procedures are usually done together to check if your muscles and nerves are healthy. If the results of the tests are abnormal, this  may indicatedisease or injury, such as a neuromuscular disease or peripheral nerve damage. Tell a health care provider about: Any allergies you have. All medicines you are taking, including vitamins, herbs, eye drops, creams, and over-the-counter medicines. Any problems you or family members have had with anesthetic medicines. Any blood disorders you have. Any surgeries you have had. Any medical conditions you have. If you have a pacemaker. Whether you are pregnant or may be pregnant. What are the risks? Generally, this is a safe procedure. However, problems may occur, including: Infection where the electrodes were inserted. Bleeding. What happens before the procedure? Medicines Ask your health care provider about: Changing or stopping your regular medicines. This is especially important if you are taking diabetes medicines or blood thinners. Taking medicines such as aspirin and ibuprofen. These medicines can thin your blood. Do not take these medicines unless your health care provider tells you to take them. Taking over-the-counter medicines, vitamins, herbs, and supplements. General instructions Your health care provider may ask you to avoid: Beverages that have caffeine, such as coffee and tea. Any products that contain nicotine or tobacco. These products include cigarettes, e-cigarettes, and chewing tobacco. If you need help quitting, ask your health care provider. Do not use lotions or creams on the same day that you will be having the procedure. What happens during the procedure? For EMG  Your health care  provider will ask you to stay in a position so that he or she can access the muscle that will be studied. You may be standing, sitting, or lying down. You may be given a medicine that numbs the area (local anesthetic). A very thin needle that has an electrode will be inserted into your muscle. Another small electrode will be placed on your skin near the muscle. Your health care  provider will ask you to continue to remain still. The electrodes will send a signal that tells about the electrical activity of your muscles. You may see this on a monitor or hear it in the room. After your muscles have been studied at rest, your health care provider will ask you to contract or flex your muscles. The electrodes will send a signal that tells about the electrical activity of your muscles. Your health care provider will remove the electrodes and the electrode needles when the procedure is finished. The procedure may vary among health care providers and hospitals. For NCS  An electrode that records your nerve activity (recording electrode) will be placed on your skin by the muscle that is being studied. An electrode that is used as a reference (reference electrode) will be placed near the recording electrode. A paste or gel will be applied to your skin between the recording electrode and the reference electrode. Your nerve will be stimulated with a mild shock. Your health care provider will measure how much time it takes for your muscle to react. Your health care provider will remove the electrodes and the gel when the procedure is finished. The procedure may vary among health care providers and hospitals. What happens after the procedure? It is up to you to get the results of your procedure. Ask your health care provider, or the department that is doing the procedure, when your results will be ready. Your health care provider may: Give you medicines for any pain. Monitor the insertion sites to make sure that bleeding stops. Summary Electromyoneurogram is a test to check how well your muscles and nerves are working. If the results of the tests are abnormal, this may indicate disease or injury. This is a safe procedure. However, problems may occur, such as bleeding and infection. Your health care provider will do two tests to complete this procedure. One checks your muscles (EMG)  and another checks your nerves (NCS). It is up to you to get the results of your procedure. Ask your health care provider, or the department that is doing the procedure, when your results will be ready. This information is not intended to replace advice given to you by your health care provider. Make sure you discuss any questions you have with your healthcare provider. Document Revised: 03/10/2018 Document Reviewed: 02/20/2018 Elsevier Patient Education  2022 [employer].

## 2021-03-08 ENCOUNTER — Telehealth: Payer: Self-pay | Admitting: Neurology

## 2021-03-08 NOTE — Telephone Encounter (Signed)
Referral sent to [facility]. Noted on the referral that patient has NCS/EMG scheduled at our office 10/6. Phone: [phone].

## 2021-04-12 ENCOUNTER — Encounter: Payer: Managed Care, Other (non HMO) | Admitting: Neurology

## 2021-05-17 ENCOUNTER — Ambulatory Visit (INDEPENDENT_AMBULATORY_CARE_PROVIDER_SITE_OTHER): Payer: Managed Care, Other (non HMO) | Admitting: Neurology

## 2021-05-17 DIAGNOSIS — M25531 Pain in right wrist: Secondary | ICD-10-CM

## 2021-05-17 DIAGNOSIS — Z0289 Encounter for other administrative examinations: Secondary | ICD-10-CM

## 2021-05-17 DIAGNOSIS — R202 Paresthesia of skin: Secondary | ICD-10-CM

## 2021-05-17 DIAGNOSIS — R200 Anesthesia of skin: Secondary | ICD-10-CM

## 2021-05-17 DIAGNOSIS — M25532 Pain in left wrist: Secondary | ICD-10-CM

## 2021-05-17 NOTE — Progress Notes (Signed)
Mri cervical spine - no neck pain or radicular  Hand center for steroids injections and/or xrays/US  Today patient reports that the wrist splints make him feel better but he is definitely still having pain in his first and second digits more so with use especially with keyboard use and mouse use.  The carpal tunnel braces help but he is also having wrist pain.  Burning sensations in his thumb and first finger.  Even though the nerve conductions were it does suggest that he is having median nerve irritation or compression may be below the threshold of this exam.  He denies any neck pain or radicular symptoms so I do not think that this is coming out of his neck and we do not need an MRI of the cervical spine.  I suggested going to the hand center for steroid injections and/or x-rays of the hand or ultrasound of the carpal tunnel and if that does not work may still consider carpal tunnel release given his symptoms and clinical signs.  10 minutes

## 2021-05-17 NOTE — Progress Notes (Signed)
Full Name: Jeffrey Bender Gender: Male MRN #: [id number] Date of Birth: [birth year]    Visit Date: 05/17/2021 09:54 Age: 45 Years Examining Physician: Naomie Dean, MD  Requesting Provider: Ileana Ladd, MD Primary Care Provider:  Ileana Ladd, MD Height: 5 feet 11 inch 167lbs  Patient History:  Hand and wrist pain and sensory changes consistent with Carpal Tunnel Syndrome.  Summary: Nerve Conduction Studies were performed on the bilateral upper extremities. All nerves and muscles (as indicated in the following tables) were within normal limits.      Conclusion: This is a normal study. No electrophysiologic evidence for mononeuropathy(Carpal Tunnel Syndrome), polyneuropathy or radiculopathy. However patient's symptoms and clinical exam are consistent with median nerve irritation at the wrist, will refer him to a provider who may be able to try injections or provide more imaging such as ultrasound or MRI to further examine. No neck pain or radicular symptoms.  Orders Placed This Encounter  Procedures   Ambulatory referral to Orthopedic Surgery     ------------------------------- Naomie Dean, M.D.  [facility] [address] [place], [place] [address] Tel: [phone] Fax: [phone]  Verbal informed consent was obtained from the patient, patient was informed of potential risk of procedure, including bruising, bleeding, hematoma formation, infection, muscle weakness, muscle pain, numbness, among others.        MNC    Nerve / Sites Muscle Latency Ref. Amplitude Ref. Rel Amp Segments Distance Velocity Ref. Area    ms ms mV mV %  cm m/s m/s mVms  R Median - APB     Wrist APB 2.9 ?4.4 7.9 ?4.0 100 Wrist - APB 7   27.1     Upper arm APB 7.3  7.4  93.7 Upper arm - Wrist 22 51 ?49 25.2  L Median - APB     Wrist APB 3.1 ?4.4 9.6 ?4.0 100 Wrist - APB 7   33.5     Upper arm APB 7.1  9.4  98.8 Upper arm - Wrist 22 54 ?49 33.7  R  Ulnar - ADM     Wrist ADM 2.2 ?3.3 6.8 ?6.0 100 Wrist - ADM 7   35.6     B.Elbow ADM 5.8  6.8  99.6 B.Elbow - Wrist 21 59 ?49 30.0     A.Elbow ADM 7.5  6.9  102 A.Elbow - B.Elbow 10 57 ?49 30.2  L Ulnar - ADM     Wrist ADM 2.3 ?3.3 7.3 ?6.0 100 Wrist - ADM 7   33.1     B.Elbow ADM 5.5  6.5  89 B.Elbow - Wrist 21 65 ?49 33.8     A.Elbow ADM 7.0  6.2  94.8 A.Elbow - B.Elbow 10 64 ?49 34.5             SNC    Nerve / Sites Rec. Site Peak Lat Ref.  Amp Ref. Segments Distance Peak Diff Ref.    ms ms V V  cm ms ms  R Median, Ulnar - Transcarpal comparison     Median Palm Wrist 2.0 ?2.2 79 ?35 Median Palm - Wrist 8       Ulnar Palm Wrist 2.0 ?2.2 16 ?12 Ulnar Palm - Wrist 8          Median Palm - Ulnar Palm  0.0 ?0.4  L Median, Ulnar - Transcarpal comparison     Median Palm Wrist 2.0 ?2.2 97 ?35 Median Palm - Wrist 8  Ulnar Palm Wrist 2.1 ?2.2 32 ?12 Ulnar Palm - Wrist 8          Median Palm - Ulnar Palm  -0.1 ?0.4  R Median - Orthodromic (Dig II, Mid palm)     Dig II Wrist 2.8 ?3.4 27 ?10 Dig II - Wrist 13    L Median - Orthodromic (Dig II, Mid palm)     Dig II Wrist 2.9 ?3.4 35 ?10 Dig II - Wrist 13    R Ulnar - Orthodromic, (Dig V, Mid palm)     Dig V Wrist 2.6 ?3.1 8 ?5 Dig V - Wrist 11    L Ulnar - Orthodromic, (Dig V, Mid palm)     Dig V Wrist 2.9 ?3.1 10 ?5 Dig V - Wrist 33                   F  Wave    Nerve F Lat Ref.   ms ms  R Ulnar - ADM 27.6 ?32.0  L Ulnar - ADM 27.9 ?32.0         EMG Summary Table    Spontaneous MUAP Recruitment  Muscle IA Fib PSW Fasc Other Amp Dur. Poly Pattern  L. Cervical paraspinals (low) Normal None None None _______ Normal Normal Normal Normal  R. Cervical paraspinals (low) Normal None None None _______ Normal Normal Normal Normal  L. Deltoid Normal None None None _______ Normal Normal Normal Normal  R. Deltoid Normal None None None _______ Normal Normal Normal Normal  L. Triceps brachii Normal None None None _______ Normal Normal Normal  Normal  R. Triceps brachii Normal None None None _______ Normal Normal Normal Normal  L. Pronator teres Normal None None None _______ Normal Normal Normal Normal  R. Pronator teres Normal None None None _______ Normal Normal Normal Normal  L. First dorsal interosseous Normal None None None _______ Normal Normal Normal Normal  R. First dorsal interosseous Normal None None None _______ Normal Normal Normal Normal  L. Opponens pollicis Normal None None None _______ Normal Normal Normal Normal  R. Opponens pollicis Normal None None None _______ Normal Normal Normal Normal

## 2021-05-22 NOTE — Progress Notes (Signed)
See procedure note.

## 2021-05-22 NOTE — Procedures (Signed)
Full Name: Jeffrey Bender Gender: Male MRN #: [id number] Date of Birth: Mar 07, 1976    Visit Date: 05/17/2021 09:54 Age: 45 Years Examining Physician: Naomie Dean, MD  Requesting Provider: Ileana Ladd, MD Primary Care Provider:  Ileana Ladd, MD Height: 5 feet 11 inch 167lbs  Patient History:  Hand and wrist pain and sensory changes consistent with Carpal Tunnel Syndrome.  Summary: Nerve Conduction Studies were performed on the bilateral upper extremities. All nerves and muscles (as indicated in the following tables) were within normal limits.      Conclusion: This is a normal study. No electrophysiologic evidence for mononeuropathy(Carpal Tunnel Syndrome), polyneuropathy or radiculopathy. However patient's symptoms and clinical exam are consistent with median nerve irritation at the wrist, will refer him to a provider who may be able to try injections or provide more imaging such as ultrasound or MRI to further examine. No neck pain or radicular symptoms.  Orders Placed This Encounter  Procedures   Ambulatory referral to Orthopedic Surgery     ------------------------------- Naomie Dean, M.D.  [facility] [address] [place], [place] [address] Tel: [phone] Fax: [phone]  Verbal informed consent was obtained from the patient, patient was informed of potential risk of procedure, including bruising, bleeding, hematoma formation, infection, muscle weakness, muscle pain, numbness, among others.        MNC    Nerve / Sites Muscle Latency Ref. Amplitude Ref. Rel Amp Segments Distance Velocity Ref. Area    ms ms mV mV %  cm m/s m/s mVms  R Median - APB     Wrist APB 2.9 ?4.4 7.9 ?4.0 100 Wrist - APB 7   27.1     Upper arm APB 7.3  7.4  93.7 Upper arm - Wrist 22 51 ?49 25.2  L Median - APB     Wrist APB 3.1 ?4.4 9.6 ?4.0 100 Wrist - APB 7   33.5     Upper arm APB 7.1  9.4  98.8 Upper arm - Wrist 22 54 ?49 33.7  R  Ulnar - ADM     Wrist ADM 2.2 ?3.3 6.8 ?6.0 100 Wrist - ADM 7   35.6     B.Elbow ADM 5.8  6.8  99.6 B.Elbow - Wrist 21 59 ?49 30.0     A.Elbow ADM 7.5  6.9  102 A.Elbow - B.Elbow 10 57 ?49 30.2  L Ulnar - ADM     Wrist ADM 2.3 ?3.3 7.3 ?6.0 100 Wrist - ADM 7   33.1     B.Elbow ADM 5.5  6.5  89 B.Elbow - Wrist 21 65 ?49 33.8     A.Elbow ADM 7.0  6.2  94.8 A.Elbow - B.Elbow 10 64 ?49 34.5             SNC    Nerve / Sites Rec. Site Peak Lat Ref.  Amp Ref. Segments Distance Peak Diff Ref.    ms ms V V  cm ms ms  R Median, Ulnar - Transcarpal comparison     Median Palm Wrist 2.0 ?2.2 79 ?35 Median Palm - Wrist 8       Ulnar Palm Wrist 2.0 ?2.2 16 ?12 Ulnar Palm - Wrist 8          Median Palm - Ulnar Palm  0.0 ?0.4  L Median, Ulnar - Transcarpal comparison     Median Palm Wrist 2.0 ?2.2 97 ?35 Median Palm - Wrist 8  Ulnar Palm Wrist 2.1 ?2.2 32 ?12 Ulnar Palm - Wrist 8          Median Palm - Ulnar Palm  -0.1 ?0.4  R Median - Orthodromic (Dig II, Mid palm)     Dig II Wrist 2.8 ?3.4 27 ?10 Dig II - Wrist 13    L Median - Orthodromic (Dig II, Mid palm)     Dig II Wrist 2.9 ?3.4 35 ?10 Dig II - Wrist 13    R Ulnar - Orthodromic, (Dig V, Mid palm)     Dig V Wrist 2.6 ?3.1 8 ?5 Dig V - Wrist 11    L Ulnar - Orthodromic, (Dig V, Mid palm)     Dig V Wrist 2.9 ?3.1 10 ?5 Dig V - Wrist 33                   F  Wave    Nerve F Lat Ref.   ms ms  R Ulnar - ADM 27.6 ?32.0  L Ulnar - ADM 27.9 ?32.0         EMG Summary Table    Spontaneous MUAP Recruitment  Muscle IA Fib PSW Fasc Other Amp Dur. Poly Pattern  L. Cervical paraspinals (low) Normal None None None _______ Normal Normal Normal Normal  R. Cervical paraspinals (low) Normal None None None _______ Normal Normal Normal Normal  L. Deltoid Normal None None None _______ Normal Normal Normal Normal  R. Deltoid Normal None None None _______ Normal Normal Normal Normal  L. Triceps brachii Normal None None None _______ Normal Normal Normal  Normal  R. Triceps brachii Normal None None None _______ Normal Normal Normal Normal  L. Pronator teres Normal None None None _______ Normal Normal Normal Normal  R. Pronator teres Normal None None None _______ Normal Normal Normal Normal  L. First dorsal interosseous Normal None None None _______ Normal Normal Normal Normal  R. First dorsal interosseous Normal None None None _______ Normal Normal Normal Normal  L. Opponens pollicis Normal None None None _______ Normal Normal Normal Normal  R. Opponens pollicis Normal None None None _______ Normal Normal Normal Normal

## 2021-05-23 ENCOUNTER — Telehealth: Payer: Self-pay | Admitting: Neurology

## 2021-05-23 NOTE — Telephone Encounter (Signed)
Sent [facility] ph # [phone].
